# Patient Record
Sex: Female | Born: 1955 | Race: Black or African American | Hispanic: No | Smoking: Current every day smoker
Health system: Southern US, Community
[De-identification: ages and names within clinical notes are randomized; demographics above are authoritative.]

## PROBLEM LIST (undated history)

## (undated) DIAGNOSIS — I10 Essential (primary) hypertension: Secondary | ICD-10-CM

---

## 2001-03-15 ENCOUNTER — Other Ambulatory Visit: Admission: RE | Admit: 2001-03-15 | Discharge: 2001-03-15 | Payer: Self-pay | Admitting: Gynecology

## 2001-03-21 ENCOUNTER — Encounter: Payer: Self-pay | Admitting: Obstetrics and Gynecology

## 2001-03-21 ENCOUNTER — Ambulatory Visit (HOSPITAL_COMMUNITY): Admission: RE | Admit: 2001-03-21 | Discharge: 2001-03-21 | Payer: Self-pay | Admitting: Obstetrics and Gynecology

## 2001-07-07 ENCOUNTER — Emergency Department (HOSPITAL_COMMUNITY): Admission: EM | Admit: 2001-07-07 | Discharge: 2001-07-07 | Payer: Self-pay | Admitting: Emergency Medicine

## 2001-07-08 ENCOUNTER — Emergency Department (HOSPITAL_COMMUNITY): Admission: EM | Admit: 2001-07-08 | Discharge: 2001-07-08 | Payer: Self-pay | Admitting: Internal Medicine

## 2001-12-08 ENCOUNTER — Emergency Department (HOSPITAL_COMMUNITY): Admission: EM | Admit: 2001-12-08 | Discharge: 2001-12-08 | Payer: Self-pay | Admitting: Emergency Medicine

## 2001-12-09 ENCOUNTER — Encounter: Admission: RE | Admit: 2001-12-09 | Discharge: 2001-12-09 | Payer: Self-pay | Admitting: Internal Medicine

## 2001-12-16 ENCOUNTER — Encounter: Admission: RE | Admit: 2001-12-16 | Discharge: 2001-12-16 | Payer: Self-pay | Admitting: Internal Medicine

## 2006-02-25 ENCOUNTER — Emergency Department (HOSPITAL_COMMUNITY): Admission: EM | Admit: 2006-02-25 | Discharge: 2006-02-25 | Payer: Self-pay | Admitting: Emergency Medicine

## 2006-03-31 ENCOUNTER — Emergency Department (HOSPITAL_COMMUNITY): Admission: EM | Admit: 2006-03-31 | Discharge: 2006-03-31 | Payer: Self-pay | Admitting: Family Medicine

## 2010-05-21 ENCOUNTER — Emergency Department (HOSPITAL_COMMUNITY): Admission: EM | Admit: 2010-05-21 | Discharge: 2010-05-21 | Payer: Self-pay | Admitting: Emergency Medicine

## 2013-12-16 ENCOUNTER — Emergency Department (HOSPITAL_COMMUNITY)
Admission: EM | Admit: 2013-12-16 | Discharge: 2013-12-16 | Disposition: A | Discharge status: Home / Self Care | Payer: Self-pay | Attending: Emergency Medicine | Admitting: Emergency Medicine

## 2013-12-16 ENCOUNTER — Encounter (HOSPITAL_COMMUNITY): Payer: Self-pay | Admitting: Emergency Medicine

## 2013-12-16 DIAGNOSIS — H5789 Other specified disorders of eye and adnexa: Secondary | ICD-10-CM | POA: Insufficient documentation

## 2013-12-16 DIAGNOSIS — L235 Allergic contact dermatitis due to other chemical products: Secondary | ICD-10-CM

## 2013-12-16 DIAGNOSIS — Z79899 Other long term (current) drug therapy: Secondary | ICD-10-CM | POA: Insufficient documentation

## 2013-12-16 DIAGNOSIS — L258 Unspecified contact dermatitis due to other agents: Secondary | ICD-10-CM | POA: Insufficient documentation

## 2013-12-16 DIAGNOSIS — IMO0002 Reserved for concepts with insufficient information to code with codable children: Secondary | ICD-10-CM | POA: Insufficient documentation

## 2013-12-16 MED ORDER — FAMOTIDINE 20 MG PO TABS
20.0000 mg | ORAL_TABLET | Freq: Once | ORAL | Status: AC
  Administered 2013-12-16: 20 mg via ORAL
  Filled 2013-12-16: qty 1

## 2013-12-16 MED ORDER — PREDNISONE 20 MG PO TABS
60.0000 mg | ORAL_TABLET | Freq: Once | ORAL | Status: AC
  Administered 2013-12-16: 60 mg via ORAL
  Filled 2013-12-16: qty 3

## 2013-12-16 MED ORDER — DIPHENHYDRAMINE HCL 25 MG PO TABS: 25.0000 mg | ORAL_TABLET | Freq: Four times a day (QID) | ORAL | Status: DC

## 2013-12-16 MED ORDER — FAMOTIDINE 20 MG PO TABS: 20.0000 mg | ORAL_TABLET | Freq: Two times a day (BID) | ORAL | Status: DC

## 2013-12-16 MED ORDER — PREDNISONE 20 MG PO TABS: 60.0000 mg | ORAL_TABLET | Freq: Every day | ORAL | Status: DC

## 2013-12-16 NOTE — ED Notes (Signed)
Pt brought back to room; pt getting undressed and into a gown at this time; Crecencio Mc, RN present in room

## 2013-12-16 NOTE — ED Notes (Addendum)
Pt states that she had a reaction to her hair dye. Pt used a different brand than usual. Pt states that she began itching in her scalp and face on Friday. Pt states that it is itching and swelling in her eyes as well. Pt states that her vision is normal when swelling decreases.  Pt has drainage noted from scalp as well. Pt states that she took allergy medications. Pt speaking in full sentences. NAD noted.

## 2013-12-16 NOTE — Discharge Instructions (Signed)

## 2013-12-19 NOTE — ED Provider Notes (Signed)
Medical screening examination/treatment/procedure(s) were performed by non-physician practitioner and as supervising physician I was immediately available for consultation/collaboration.     Geoffery Lyonsouglas Kyvon Hu, MD 12/19/13 [id number]

## 2013-12-19 NOTE — ED Provider Notes (Signed)
CSN: [id number]     Arrival date & time 12/16/13  0810 History   First MD Initiated Contact with Patient 12/16/13 0813     Chief Complaint  Patient presents with  . Allergic Reaction     (Consider location/radiation/quality/duration/timing/severity/associated sxs/prior Treatment) Patient is a 58 y.o. female presenting with allergic reaction. The history is provided by the patient. No language interpreter was used.  Allergic Reaction Presenting symptoms: itching, rash and swelling   Presenting symptoms: no difficulty swallowing   Severity:  Severe Context comment:  Rash and facial swelling after using a hair color treatment 2 days ago. She reports similar symptoms in the past. No SOB, or difficulty swallowing.   History reviewed. No pertinent past medical history. History reviewed. No pertinent past surgical history. History reviewed. No pertinent family history. History  Substance Use Topics  . Smoking status: Never Smoker   . Smokeless tobacco: Not on file  . Alcohol Use: Not on file   OB History   Grav Para Term Preterm Abortions TAB SAB Ect Mult Living                 Review of Systems  Constitutional: Negative for fever.  HENT: Positive for facial swelling. Negative for trouble swallowing.   Eyes:       Periorbital swelling.  Respiratory: Negative for shortness of breath.   Cardiovascular: Negative for chest pain.  Gastrointestinal: Negative for nausea.  Skin: Positive for itching and rash.  Neurological: Negative for headaches.      Allergies  Codeine  Home Medications   Prior to Admission medications   Medication Sig Start Date End Date Taking? Authorizing Provider  diphenhydrAMINE (SOMINEX) 25 MG tablet Take 25 mg by mouth 2 (two) times daily as needed for itching or allergies.   Yes Historical Provider, MD  diphenhydrAMINE (BENADRYL) 25 MG tablet Take 1 tablet (25 mg total) by mouth every 6 (six) hours. 12/16/13   Albert Hersch A Ziair Penson, PA-C  famotidine (PEPCID)  20 MG tablet Take 1 tablet (20 mg total) by mouth 2 (two) times daily. 12/16/13   Brace Welte A Galaxy Borden, PA-C  predniSONE (DELTASONE) 20 MG tablet Take 3 tablets (60 mg total) by mouth daily. 12/16/13   Melane Windholz A Tatiana Courter, PA-C   BP 142/80  Pulse 106  Temp(Src) 98.4 F (36.9 C) (Oral)  Resp 20  SpO2 96% Physical Exam  Constitutional: She is oriented to person, place, and time. She appears well-developed and well-nourished.  HENT:  Head: Normocephalic.  Eyes:  Significant bilateral periorbital swelling. Clear eye drainage and red conjunctiva without chemosis.   Neck: Normal range of motion. Neck supple.  Cardiovascular: Normal rate and regular rhythm.   Pulmonary/Chest: Effort normal and breath sounds normal.  Abdominal: Soft. Bowel sounds are normal. There is no tenderness. There is no rebound and no guarding.  Musculoskeletal: Normal range of motion.  Neurological: She is alert and oriented to person, place, and time.  Skin: Skin is warm and dry. No rash noted.  Scaling rash to generalized scalp including bilateral ear pinna.   Psychiatric: She has a normal mood and affect.    ED Course  Procedures (including critical care time) Labs Review Labs Reviewed - No data to display  Imaging Review No results found.   EKG Interpretation None      MDM   Final diagnoses:  Allergic contact dermatitis due to chemical    Uncomplicated significant contact dermatitis. Oral steroids given, benadryl, pepcid. Return precautions given.  Arnoldo HookerShari A Osei Anger, PA-C 12/19/13 1222

## 2013-12-24 ENCOUNTER — Encounter (HOSPITAL_COMMUNITY): Payer: Self-pay | Admitting: Emergency Medicine

## 2013-12-24 ENCOUNTER — Emergency Department (HOSPITAL_COMMUNITY)
Admission: EM | Admit: 2013-12-24 | Discharge: 2013-12-24 | Disposition: A | Discharge status: Home / Self Care | Payer: Self-pay | Attending: Emergency Medicine | Admitting: Emergency Medicine

## 2013-12-24 DIAGNOSIS — L235 Allergic contact dermatitis due to other chemical products: Secondary | ICD-10-CM

## 2013-12-24 DIAGNOSIS — L258 Unspecified contact dermatitis due to other agents: Secondary | ICD-10-CM | POA: Insufficient documentation

## 2013-12-24 DIAGNOSIS — IMO0002 Reserved for concepts with insufficient information to code with codable children: Secondary | ICD-10-CM | POA: Insufficient documentation

## 2013-12-24 DIAGNOSIS — Z79899 Other long term (current) drug therapy: Secondary | ICD-10-CM | POA: Insufficient documentation

## 2013-12-24 DIAGNOSIS — F17200 Nicotine dependence, unspecified, uncomplicated: Secondary | ICD-10-CM | POA: Insufficient documentation

## 2013-12-24 MED ORDER — PREDNISONE 20 MG PO TABS: ORAL_TABLET | ORAL | Status: DC

## 2013-12-24 MED ORDER — PREDNISONE 20 MG PO TABS
60.0000 mg | ORAL_TABLET | Freq: Once | ORAL | Status: AC
  Administered 2013-12-24: 60 mg via ORAL
  Filled 2013-12-24: qty 3

## 2013-12-24 NOTE — ED Notes (Signed)
Pt placed in gown and on monitor upon arrival to room. Pt monitored by 5 lead, pulse ox, and blood pressure.

## 2013-12-24 NOTE — Discharge Instructions (Signed)
Please read and follow all provided instructions.  Your diagnoses today include:  1. Chemical induced allergic contact dermatitis     Tests performed today include:  Vital signs. See below for your results today.   Medications prescribed:   Prednisone - steroid medicine   It is best to take this medication in the morning to prevent sleeping problems. If you are diabetic, monitor your blood sugar closely and stop taking Prednisone if blood sugar is over 300. Take with food to prevent stomach upset.   Take any prescribed medications only as directed.  Home care instructions:   Follow any educational materials contained in this packet  Follow-up instructions: Please follow-up with your primary care provider in the next 3 days for further evaluation of your symptoms. If you do not have a primary care doctor -- see below for referral information.   Return instructions:   Please return to the Emergency Department if you experience worsening symptoms.   Call 9-1-1 immediately if you have an allergic reaction that involves your lips, mouth, throat or if you have any difficulty breathing. This is a life-threatening emergency.   Please return if you have any other emergent concerns.  Additional Information:  Your vital signs today were: BP 131/93   Pulse 91   Temp(Src) 98.1 F (36.7 C) (Oral)   Resp 15   SpO2 99% If your blood pressure (BP) was elevated above 135/85 this visit, please have this repeated by your doctor within one month. --------------

## 2013-12-24 NOTE — ED Provider Notes (Signed)
CSN: [id number]     Arrival date & time 12/24/13  1051 History   First MD Initiated Contact with Patient 12/24/13 1235     Chief Complaint  Patient presents with  . Allergic Reaction     (Consider location/radiation/quality/duration/timing/severity/associated sxs/prior Treatment) HPI Comments: Patient presents with complaint of allergic reaction to scalp, ears, and face -- started several days ago after using new hair dye. Patient was seen in emergency department and prescribed prednisone which helped improve her symptoms and itching. Patient had 4 total days of steroids. Upon discontinuation she had symptoms return with much more redness and itchiness. No fevers, trouble breathing, trouble swallowing. No mouth swelling. Patient reports no eye burning or visual disturbance. The onset of this condition was acute. The course is constant. Aggravating factors: none. Alleviating factors: none.    Patient is a 58 y.o. female presenting with allergic reaction. The history is provided by the patient and medical records.  Allergic Reaction Presenting symptoms: rash   Presenting symptoms: no difficulty swallowing and no wheezing     No past medical history on file. No past surgical history on file. No family history on file. History  Substance Use Topics  . Smoking status: Current Every Day Smoker -- 1.00 packs/day    Types: Cigarettes  . Smokeless tobacco: Not on file  . Alcohol Use: No   OB History   Grav Para Term Preterm Abortions TAB SAB Ect Mult Living                 Review of Systems  Constitutional: Negative for fever.  HENT: Negative for facial swelling and trouble swallowing.   Eyes: Negative for redness.  Respiratory: Negative for shortness of breath, wheezing and stridor.   Cardiovascular: Negative for chest pain.  Gastrointestinal: Negative for nausea and vomiting.  Musculoskeletal: Negative for myalgias.  Skin: Positive for rash.  Neurological: Negative for  light-headedness.  Psychiatric/Behavioral: Negative for confusion.      Allergies  Codeine  Home Medications   Prior to Admission medications   Medication Sig Start Date End Date Taking? Authorizing Ephriam Turman  diphenhydrAMINE (BENADRYL) 25 MG tablet Take 1 tablet (25 mg total) by mouth every 6 (six) hours. 12/16/13   Shari A Upstill, PA-C  diphenhydrAMINE (SOMINEX) 25 MG tablet Take 25 mg by mouth 2 (two) times daily as needed for itching or allergies.    Historical Shonita Rinck, MD  famotidine (PEPCID) 20 MG tablet Take 1 tablet (20 mg total) by mouth 2 (two) times daily. 12/16/13   Shari A Upstill, PA-C  predniSONE (DELTASONE) 20 MG tablet Take 3 tablets (60 mg total) by mouth daily. 12/16/13   Shari A Upstill, PA-C   BP 131/93  Pulse 91  Temp(Src) 98.1 F (36.7 C) (Oral)  Resp 15  SpO2 99% Physical Exam  Nursing note and vitals reviewed. Constitutional: She appears well-developed and well-nourished.  HENT:  Head: Normocephalic and atraumatic.  No angioedema  Eyes: Conjunctivae are normal.  Neck: Normal range of motion. Neck supple.  Pulmonary/Chest: No respiratory distress.  Neurological: She is alert.  Skin: Skin is warm and dry.  Patient with erythema of the cheeks and lateral face. Patient with vesicular rash noted on forehead, ears, back of neck, and scalp consistent with contact dermatitis.  Psychiatric: She has a normal mood and affect.    ED Course  Procedures (including critical care time) Labs Review Labs Reviewed - No data to display  Imaging Review No results found.   EKG Interpretation None  1:16 PM Patient seen and examined. Medications ordered.   Vital signs reviewed and are as follows: Filed Vitals:   12/24/13 1245  BP: 131/93  Pulse: 91  Temp:   Resp: 15   Will prescribe 12 day taper course of prednisone. Patient to continue antihistamines for itching. She will avoid this hair product in the future.   MDM   Final diagnoses:  Chemical  induced allergic contact dermatitis   Patient with contact dermatitis. No signs of anaphylaxis. No signs of secondary infection. No contraindications to prednisone noted. Will start back on prednisone and give prolonged taper to prevent rebound.    Renne CriglerJoshua Geiple, PA-C 12/24/13 1326

## 2013-12-24 NOTE — ED Notes (Signed)
Pt reports recent allergic reaction to hair color. States when she finished prescription for prednisone, itching, reddness, swelling around face returned. Pt currently awake, alert, oriented x4, NAD at present.

## 2013-12-25 NOTE — ED Provider Notes (Signed)
Medical screening examination/treatment/procedure(s) were performed by non-physician practitioner and as supervising physician I was immediately available for consultation/collaboration.   EKG Interpretation None       Raeford RazorStephen Kohut, MD 12/25/13 [phone]

## 2015-11-15 ENCOUNTER — Emergency Department (HOSPITAL_COMMUNITY)
Admission: EM | Admit: 2015-11-15 | Discharge: 2015-11-15 | Disposition: A | Discharge status: Home / Self Care | Payer: No Typology Code available for payment source | Attending: Emergency Medicine | Admitting: Emergency Medicine

## 2015-11-15 ENCOUNTER — Encounter (HOSPITAL_COMMUNITY): Payer: Self-pay | Admitting: Emergency Medicine

## 2015-11-15 ENCOUNTER — Emergency Department (HOSPITAL_COMMUNITY): Payer: No Typology Code available for payment source

## 2015-11-15 DIAGNOSIS — S161XXA Strain of muscle, fascia and tendon at neck level, initial encounter: Secondary | ICD-10-CM | POA: Diagnosis not present

## 2015-11-15 DIAGNOSIS — S199XXA Unspecified injury of neck, initial encounter: Secondary | ICD-10-CM | POA: Diagnosis present

## 2015-11-15 DIAGNOSIS — Z79899 Other long term (current) drug therapy: Secondary | ICD-10-CM | POA: Diagnosis not present

## 2015-11-15 DIAGNOSIS — Y998 Other external cause status: Secondary | ICD-10-CM | POA: Insufficient documentation

## 2015-11-15 DIAGNOSIS — Y92410 Unspecified street and highway as the place of occurrence of the external cause: Secondary | ICD-10-CM | POA: Insufficient documentation

## 2015-11-15 DIAGNOSIS — F17210 Nicotine dependence, cigarettes, uncomplicated: Secondary | ICD-10-CM | POA: Insufficient documentation

## 2015-11-15 DIAGNOSIS — Y9389 Activity, other specified: Secondary | ICD-10-CM | POA: Insufficient documentation

## 2015-11-15 DIAGNOSIS — I10 Essential (primary) hypertension: Secondary | ICD-10-CM | POA: Insufficient documentation

## 2015-11-15 DIAGNOSIS — S4992XA Unspecified injury of left shoulder and upper arm, initial encounter: Secondary | ICD-10-CM | POA: Diagnosis not present

## 2015-11-15 HISTORY — DX: Essential (primary) hypertension: I10

## 2015-11-15 MED ORDER — CYCLOBENZAPRINE HCL 10 MG PO TABS
5.0000 mg | ORAL_TABLET | Freq: Once | ORAL | Status: AC
  Administered 2015-11-15: 5 mg via ORAL
  Filled 2015-11-15: qty 1

## 2015-11-15 MED ORDER — CYCLOBENZAPRINE HCL 10 MG PO TABS: 10.0000 mg | ORAL_TABLET | Freq: Two times a day (BID) | ORAL | Status: AC | PRN

## 2015-11-15 MED ORDER — ACETAMINOPHEN 325 MG PO TABS
650.0000 mg | ORAL_TABLET | Freq: Once | ORAL | Status: AC
  Administered 2015-11-15: 650 mg via ORAL
  Filled 2015-11-15: qty 2

## 2015-11-15 MED ORDER — ACETAMINOPHEN 500 MG PO TABS: 500.0000 mg | ORAL_TABLET | Freq: Four times a day (QID) | ORAL | Status: DC | PRN
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
## 2021-06-27 ENCOUNTER — Other Ambulatory Visit: Payer: Self-pay

## 2021-06-27 ENCOUNTER — Encounter (HOSPITAL_COMMUNITY): Payer: Self-pay

## 2021-06-27 ENCOUNTER — Emergency Department (HOSPITAL_COMMUNITY)
Admission: EM | Admit: 2021-06-27 | Discharge: 2021-06-27 | Disposition: A | Discharge status: Home / Self Care | Payer: Self-pay | Attending: Emergency Medicine | Admitting: Emergency Medicine

## 2021-06-27 DIAGNOSIS — T7840XA Allergy, unspecified, initial encounter: Secondary | ICD-10-CM | POA: Insufficient documentation

## 2021-06-27 DIAGNOSIS — F17210 Nicotine dependence, cigarettes, uncomplicated: Secondary | ICD-10-CM | POA: Insufficient documentation

## 2021-06-27 DIAGNOSIS — I10 Essential (primary) hypertension: Secondary | ICD-10-CM | POA: Insufficient documentation

## 2021-06-27 MED ORDER — DIPHENHYDRAMINE HCL 25 MG PO CAPS
50.0000 mg | ORAL_CAPSULE | Freq: Once | ORAL | Status: AC
  Administered 2021-06-27: 03:00:00 50 mg via ORAL
  Filled 2021-06-27: qty 2

## 2021-06-27 MED ORDER — FAMOTIDINE 20 MG PO TABS
20.0000 mg | ORAL_TABLET | Freq: Once | ORAL | Status: AC
  Administered 2021-06-27: 03:00:00 20 mg via ORAL
  Filled 2021-06-27: qty 1

## 2021-06-27 MED ORDER — PREDNISONE 20 MG PO TABS
60.0000 mg | ORAL_TABLET | Freq: Once | ORAL | Status: AC
  Administered 2021-06-27: 03:00:00 60 mg via ORAL
  Filled 2021-06-27: qty 3

## 2021-06-27 MED ORDER — FAMOTIDINE 20 MG PO TABS: 20.0000 mg | ORAL_TABLET | Freq: Two times a day (BID) | ORAL | 0 refills | Status: AC

## 2021-06-27 MED ORDER — DIPHENHYDRAMINE HCL 25 MG PO TABS: 25.0000 mg | ORAL_TABLET | Freq: Four times a day (QID) | ORAL | 0 refills | Status: AC

## 2021-06-27 MED ORDER — PREDNISONE 20 MG PO TABS: 40.0000 mg | ORAL_TABLET | Freq: Every day | ORAL | 0 refills | Status: AC

## 2021-06-27 NOTE — ED Provider Notes (Signed)
[facility] Emergency Department Provider Note MRN:  [id number]  Arrival date & time: 06/27/21     Chief Complaint   Allergic Reaction   History of Present Illness   Kayla Blackburn is a 65 y.o. year-old female with no pertinent past medical history presenting to the ED with chief complaint of allergic reaction.  Patient used a hair dye 2 days ago and since then has been experiencing swelling to the periorbital region and scattered rash to the scalp and back of the neck.  Has had a similar allergic reaction to a similar product several years ago.  Swelling is starting to make it difficult to open her eyes.  No swelling to the lips or tongue, no sensation of throat closure, no shortness of breath, no vomiting or diarrhea.  Symptoms mild to moderate, constant, no exacerbating or alleviating factors.  Review of Systems  A complete 10 system review of systems was obtained and all systems are negative except as noted in the HPI and PMH.   Patient's Health History    Past Medical History:  Diagnosis Date   Hypertension     History reviewed. No pertinent surgical history.  History reviewed. No pertinent family history.  Social History   Socioeconomic History   Marital status: Divorced    Spouse name: Not on file   Number of children: Not on file   Years of education: Not on file   Highest education level: Not on file  Occupational History   Not on file  Tobacco Use   Smoking status: Every Day    Packs/day: 1.00    Types: Cigarettes   Smokeless tobacco: Not on file  Substance and Sexual Activity   Alcohol use: No   Drug use: No   Sexual activity: Never    Birth control/protection: None  Other Topics Concern   Not on file  Social History Narrative   Not on file   Social Determinants of Health   Financial Resource Strain: Not on file  Food Insecurity: Not on file  Transportation Needs: Not on file  Physical Activity: Not on file  Stress: Not  on file  Social Connections: Not on file  Intimate Partner Violence: Not on file     Physical Exam   Vitals:   06/27/21 0159  BP: (!) 164/85  Pulse: 96  Resp: 20  Temp: 98 F (36.7 C)  SpO2: 98%    CONSTITUTIONAL: Well-appearing, NAD NEURO:  Alert and oriented x 3, no focal deficits EYES:  eyes equal and reactive ENT/NECK:  no LAD, no JVD CARDIO: Regular rate, well-perfused, normal S1 and S2 PULM:  CTAB no wheezing or rhonchi GI/GU:  normal bowel sounds, non-distended, non-tender MSK/SPINE:  No gross deformities, no edema SKIN: Prominent periorbital edema bilaterally, right greater than left, scattered maculopapular rash to the neck posteriorly PSYCH:  Appropriate speech and behavior  *Additional and/or pertinent findings included in MDM below  Diagnostic and Interventional Summary    EKG Interpretation  Date/Time:    Ventricular Rate:    PR Interval:    QRS Duration:   QT Interval:    QTC Calculation:   R Axis:     Text Interpretation:         Labs Reviewed - No data to display  No orders to display    Medications  predniSONE (DELTASONE) tablet 60 mg (has no administration in time range)  diphenhydrAMINE (BENADRYL) capsule 50 mg (has no administration in time range)  famotidine (PEPCID)  tablet 20 mg (has no administration in time range)     Procedures  /  Critical Care Procedures  ED Course and Medical Decision Making  I have reviewed the triage vital signs, the nursing notes, and pertinent available records from the EMR.  Listed above are laboratory and imaging tests that I personally ordered, reviewed, and interpreted and then considered in my medical decision making (see below for details).  Consistent with allergic reaction, given the facial involvement will provide a burst of steroids, antihistamines.  No airway concerns, nothing to suggest anaphylaxis, appropriate for discharge.       Elmer Sow. Pilar Plate, MD [facility] Emergency Medicine [facility] [email or web address]  Final Clinical Impressions(s) / ED Diagnoses     ICD-10-CM   1. Allergic reaction, initial encounter  T78.40XA       ED Discharge Orders          Ordered    predniSONE (DELTASONE) 20 MG tablet  Daily        06/27/21 0255    diphenhydrAMINE (BENADRYL) 25 MG tablet  Every 6 hours        06/27/21 0255    famotidine (PEPCID) 20 MG tablet  2 times daily        06/27/21 0255             Discharge Instructions Discussed with and Provided to Patient:    Discharge Instructions      You were evaluated in the Emergency Department and after careful evaluation, we did not find any emergent condition requiring admission or further testing in the hospital.  Your exam/testing today is overall reassuring.  Symptoms seem to be due to an allergic reaction.  Please take the prednisone medication as directed.  Use the Benadryl and Pepcid as needed for swelling or itching.  Please return to the Emergency Department if you experience any worsening of your condition.   Thank you for allowing [place] to be a part of your care.       Sabas Sous, MD 06/27/21 [phone]

## 2021-06-27 NOTE — ED Triage Notes (Signed)
Pt reports with bilateral eye swelling after dying her hair on Thursday.Pt states that the swelling started 2 days ago. Pt has a rash to the back of her neck and scabs on her scalp.

## 2021-06-27 NOTE — Discharge Instructions (Signed)
You were evaluated in the Emergency Department and after careful evaluation, we did not find any emergent condition requiring admission or further testing in the hospital.  Your exam/testing today is overall reassuring.  Symptoms seem to be due to an allergic reaction.  Please take the prednisone medication as directed.  Use the Benadryl and Pepcid as needed for swelling or itching.  Please return to the Emergency Department if you experience any worsening of your condition.   Thank you for allowing [place] to be a part of your care.

## 2021-07-06 ENCOUNTER — Encounter (HOSPITAL_COMMUNITY): Payer: Self-pay

## 2021-07-06 ENCOUNTER — Other Ambulatory Visit: Payer: Self-pay

## 2021-07-06 ENCOUNTER — Ambulatory Visit (HOSPITAL_COMMUNITY)
Admission: EM | Admit: 2021-07-06 | Discharge: 2021-07-06 | Disposition: A | Discharge status: Home / Self Care | Payer: Self-pay | Attending: Internal Medicine | Admitting: Internal Medicine

## 2021-07-06 DIAGNOSIS — T7849XA Other allergy, initial encounter: Secondary | ICD-10-CM

## 2021-07-06 MED ORDER — PREDNISONE 10 MG (21) PO TBPK: ORAL_TABLET | Freq: Every day | ORAL | 0 refills | Status: DC

## 2021-07-06 MED ORDER — CEPHALEXIN 500 MG PO CAPS: 500.0000 mg | ORAL_CAPSULE | Freq: Three times a day (TID) | ORAL | 0 refills | Status: AC

## 2021-07-06 MED ORDER — TERBINAFINE HCL 250 MG PO TABS: 250.0000 mg | ORAL_TABLET | Freq: Every day | ORAL | 0 refills | Status: AC

## 2021-07-06 MED ORDER — HYDROXYZINE PAMOATE 50 MG PO CAPS: 50.0000 mg | ORAL_CAPSULE | Freq: Three times a day (TID) | ORAL | 0 refills | Status: DC | PRN

## 2021-07-06 NOTE — ED Triage Notes (Signed)
Pt reports bilateral face swelling after dying her on 06/23/2021. She reports being allergic to the hair dye. Pt reports ears are bleeding and she has scabs that have develop.

## 2021-07-06 NOTE — ED Provider Notes (Signed)
[facility]    CSN: [id number] Arrival date & time: 07/06/21  1014      History   Chief Complaint Chief Complaint  Patient presents with   Rash    Face swelling and scabs on both ears after using hair dye on 06/23/2021.    HPI Kayla Blackburn is a 65 y.o. female comes to the urgent care with  itchy weepy scalp which started about 2 weeks ago after she used dyed her hair.  Patient was seen in the emergency department and started on steroids, Benadryl and famotidine.  Patient's symptoms have not improved significantly over the past couple of weeks.She complains of continuous itching and weeping of serous fluid from the scalp. No fever or chills. No nausea or vomiting.   HPI  Past Medical History:  Diagnosis Date   Hypertension     There are no problems to display for this patient.   No past surgical history on file.  OB History   No obstetric history on file.      Home Medications    Prior to Admission medications   Medication Sig Start Date End Date Taking? Authorizing Provider  cephALEXin (KEFLEX) 500 MG capsule Take 1 capsule (500 mg total) by mouth 3 (three) times daily for 7 days. 07/06/21 07/13/21 Yes Khamia Stambaugh, Britta Mccreedy, MD  hydrOXYzine (VISTARIL) 50 MG capsule Take 1 capsule (50 mg total) by mouth 3 (three) times daily as needed. 07/06/21  Yes Micco Bourbeau, Britta Mccreedy, MD  predniSONE (STERAPRED UNI-PAK 21 TAB) 10 MG (21) TBPK tablet Take by mouth daily. Take 6 tabs by mouth daily  for 2 days, then 5 tabs for 2 days, then 4 tabs for 2 days, then 3 tabs for 2 days, 2 tabs for 2 days, then 1 tab by mouth daily for 2 days 07/06/21  Yes Mariangela Heldt, Britta Mccreedy, MD  terbinafine (LAMISIL) 250 MG tablet Take 1 tablet (250 mg total) by mouth daily for 14 days. 07/06/21 07/20/21 Yes Pricella Gaugh, Britta Mccreedy, MD  acetaminophen (TYLENOL) 500 MG tablet Take 1 tablet (500 mg total) by mouth every 6 (six) hours as needed. 11/15/15   Cheri Fowler, PA-C  cyclobenzaprine (FLEXERIL) 10 MG tablet  Take 1 tablet (10 mg total) by mouth 2 (two) times daily as needed for muscle spasms. 11/15/15   Cheri Fowler, PA-C  diphenhydrAMINE (BENADRYL) 25 MG tablet Take 1 tablet (25 mg total) by mouth every 6 (six) hours for 4 days. 06/27/21 07/01/21  Sabas Sous, MD  famotidine (PEPCID) 20 MG tablet Take 1 tablet (20 mg total) by mouth 2 (two) times daily for 4 days. 06/27/21 07/01/21  Sabas Sous, MD    Family History No family history on file.  Social History Social History   Tobacco Use   Smoking status: Every Day    Packs/day: 1.00    Types: Cigarettes  Substance Use Topics   Alcohol use: No   Drug use: No     Allergies   Codeine   Review of Systems Review of Systems  Musculoskeletal: Negative.   Skin:  Positive for color change, rash and wound.  Neurological: Negative.     Physical Exam Triage Vital Signs ED Triage Vitals [07/06/21 1201]  Enc Vitals Group     BP 135/83     Pulse Rate 94     Resp 16     Temp 98.7 F (37.1 C)     Temp Source Oral     SpO2 100 %  Weight      Height      Head Circumference      Peak Flow      Pain Score 0     Pain Loc      Pain Edu?      Excl. in GC?    No data found.  Updated Vital Signs BP 135/83 (BP Location: Left Arm)    Pulse 94    Temp 98.7 F (37.1 C) (Oral)    Resp 16    SpO2 100%   Visual Acuity Right Eye Distance:   Left Eye Distance:   Bilateral Distance:    Right Eye Near:   Left Eye Near:    Bilateral Near:     Physical Exam Vitals and nursing note reviewed.  Constitutional:      General: She is not in acute distress.    Appearance: She is not ill-appearing.  Cardiovascular:     Rate and Rhythm: Normal rate and regular rhythm.  Musculoskeletal:        General: Normal range of motion.  Skin:    Comments: Hypopigmented rash over the scalp.  Patient's forehead and the neck has a rash which is mildly erythematous.  Excoriation of the skin noted.  Similar lesions over the ears as well.   Neurological:     Mental Status: She is alert.     UC Treatments / Results  Labs (all labs ordered are listed, but only abnormal results are displayed) Labs Reviewed - No data to display  EKG   Radiology No results found.  Procedures Procedures (including critical care time)  Medications Ordered in UC Medications - No data to display  Initial Impression / Assessment and Plan / UC Course  I have reviewed the triage vital signs and the nursing notes.  Pertinent labs & imaging results that were available during my care of the patient were reviewed by me and considered in my medical decision making (see chart for details).     1.  Contact dermatitis secondary to cosmetics: Tapering dose of steroids Lamisil Keflex Vistaril as needed for itching Patient is advised to wash her hair with Selsun Blue to help with excoriation and scaling of the scalp Return to urgent care if symptoms worsen. Final Clinical Impressions(s) / UC Diagnoses   Final diagnoses:  Allergic reaction to cosmetics     Discharge Instructions      Please use Selsun Blue shampoo to wash your hair Keep your scalp dry and avoid wearing head scarf Take medications as prescribed If symptoms worsen please return to urgent care to be reevaluated   ED Prescriptions     Medication Sig Dispense Auth. Provider   hydrOXYzine (VISTARIL) 50 MG capsule Take 1 capsule (50 mg total) by mouth 3 (three) times daily as needed. 30 capsule Lyam Provencio, Britta Mccreedy, MD   terbinafine (LAMISIL) 250 MG tablet Take 1 tablet (250 mg total) by mouth daily for 14 days. 14 tablet Karrington Mccravy, Britta Mccreedy, MD   cephALEXin (KEFLEX) 500 MG capsule Take 1 capsule (500 mg total) by mouth 3 (three) times daily for 7 days. 20 capsule Isaac Lacson, Britta Mccreedy, MD   predniSONE (STERAPRED UNI-PAK 21 TAB) 10 MG (21) TBPK tablet Take by mouth daily. Take 6 tabs by mouth daily  for 2 days, then 5 tabs for 2 days, then 4 tabs for 2 days, then 3 tabs for 2 days,  2 tabs for 2 days, then 1 tab by mouth daily for 2 days 42 tablet  Viraj Liby, Britta Mccreedy, MD      PDMP not reviewed this encounter.   Merrilee Jansky, MD 07/06/21 1336

## 2021-07-06 NOTE — Discharge Instructions (Addendum)
Please use Selsun Blue shampoo to wash your hair Keep your scalp dry and avoid wearing head scarf Take medications as prescribed If symptoms worsen please return to urgent care to be reevaluated

## 2022-07-10 ENCOUNTER — Telehealth: Payer: Self-pay | Admitting: Family

## 2022-07-10 ENCOUNTER — Other Ambulatory Visit (HOSPITAL_COMMUNITY): Payer: Self-pay

## 2022-07-10 MED ORDER — HYDROXYZINE PAMOATE 50 MG PO CAPS
50.0000 mg | ORAL_CAPSULE | Freq: Three times a day (TID) | ORAL | 0 refills | Status: AC | PRN
  Filled 2022-07-10: qty 30, 10d supply, fill #0

## 2022-07-10 NOTE — Telephone Encounter (Signed)
Allergic reaction to hair dye. Vistaril 50 mg up to 3 times daily, #30 with no RF's.RX for above e-scribed and sent to pharmacy on record  [place] [address] Phone: [phone] Fax: [phone]

## 2022-07-11 ENCOUNTER — Other Ambulatory Visit (HOSPITAL_COMMUNITY): Payer: Self-pay

## 2022-07-25 ENCOUNTER — Other Ambulatory Visit (HOSPITAL_COMMUNITY): Payer: Self-pay

## 2022-08-08 ENCOUNTER — Ambulatory Visit: Payer: Self-pay | Admitting: Family Medicine

## 2022-08-22 ENCOUNTER — Encounter: Payer: Self-pay | Admitting: Family Medicine

## 2022-08-22 ENCOUNTER — Ambulatory Visit (INDEPENDENT_AMBULATORY_CARE_PROVIDER_SITE_OTHER): Payer: Commercial Managed Care - HMO | Admitting: Family Medicine

## 2022-08-22 ENCOUNTER — Other Ambulatory Visit: Payer: Self-pay | Admitting: Family Medicine

## 2022-08-22 VITALS — BP 126/80 | HR 97 | Temp 98.6°F | Ht 65.0 in | Wt 256.8 lb

## 2022-08-22 DIAGNOSIS — Z1231 Encounter for screening mammogram for malignant neoplasm of breast: Secondary | ICD-10-CM

## 2022-08-22 DIAGNOSIS — Z889 Allergy status to unspecified drugs, medicaments and biological substances status: Secondary | ICD-10-CM | POA: Diagnosis not present

## 2022-08-22 DIAGNOSIS — Z1211 Encounter for screening for malignant neoplasm of colon: Secondary | ICD-10-CM | POA: Diagnosis not present

## 2022-08-22 DIAGNOSIS — Z0182 Encounter for allergy testing: Secondary | ICD-10-CM

## 2022-08-22 DIAGNOSIS — Z1159 Encounter for screening for other viral diseases: Secondary | ICD-10-CM | POA: Diagnosis not present

## 2022-08-22 DIAGNOSIS — Z7689 Persons encountering health services in other specified circumstances: Secondary | ICD-10-CM

## 2022-08-22 DIAGNOSIS — Z114 Encounter for screening for human immunodeficiency virus [HIV]: Secondary | ICD-10-CM | POA: Diagnosis not present

## 2022-08-22 DIAGNOSIS — Z8639 Personal history of other endocrine, nutritional and metabolic disease: Secondary | ICD-10-CM

## 2022-08-22 NOTE — Patient Instructions (Addendum)
It was great seeing you today!  You came in to establish care, and we are going to check your blood work  I have placed a referral to an allergist and they will call you to schedule that appointment.   You can get your Mammogram at [facility] center- call to schedule to get that done (sheet provided)   Please check-out at the front desk before leaving the clinic. I'd like to see you back in 2 weeks but if you need to be seen earlier than that for any new issues we're happy to fit you in, just give [place] a call!  Feel free to call with any questions or concerns at any time, at [phone].   Take care,  Dr. Shary Key [facility]

## 2022-08-22 NOTE — Progress Notes (Unsigned)
    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 No LE edema  ASSESSMENT/PLAN:   History of thyroid disease Patient endorses history of thyroid disease. Will check TSH  Encounter to establish care Patient presents to establish care. States it has been years since she was seen by a physician due to insurance. PMH of thyroid disease and allergic reaction. Denies hx of HTN or diabetes, etc. Will get baseline set up labs today. Recommended following up in 2 weeks to discuss results and anything else we did not get to today. - CBC, CMP, A1c   History of allergic reaction Endorses  a past reaction to hair dye products. Denies ever having an anaphylactic reaction. Requests referral to an allergist for allergy testing. - referral to [facility] - mammogram ordered  - Fit testing for CRC screen  - Hep C screen - HIV  For follow up would liek video visit   [place]

## 2022-08-23 LAB — COMPREHENSIVE METABOLIC PANEL
ALT: 20 IU/L (ref 0–32)
AST: 20 IU/L (ref 0–40)
Albumin/Globulin Ratio: 1.2 (ref 1.2–2.2)
Albumin: 4 g/dL (ref 3.9–4.9)
Alkaline Phosphatase: 78 IU/L (ref 44–121)
BUN/Creatinine Ratio: 14 (ref 12–28)
BUN: 14 mg/dL (ref 8–27)
Bilirubin Total: 0.7 mg/dL (ref 0.0–1.2)
CO2: 23 mmol/L (ref 20–29)
Calcium: 9 mg/dL (ref 8.7–10.3)
Chloride: 105 mmol/L (ref 96–106)
Creatinine, Ser: 1.01 mg/dL — ABNORMAL HIGH (ref 0.57–1.00)
Globulin, Total: 3.4 g/dL (ref 1.5–4.5)
Glucose: 91 mg/dL (ref 70–99)
Potassium: 3.4 mmol/L — ABNORMAL LOW (ref 3.5–5.2)
Sodium: 143 mmol/L (ref 134–144)
Total Protein: 7.4 g/dL (ref 6.0–8.5)
eGFR: 62 mL/min/{1.73_m2} (ref >59)

## 2022-08-23 LAB — HEMOGLOBIN A1C
Est. average glucose Bld gHb Est-mCnc: 126 mg/dL
Hgb A1c MFr Bld: 6 % — ABNORMAL HIGH (ref 4.8–5.6)

## 2022-08-23 LAB — CBC
Hematocrit: 39.8 % (ref 34.0–46.6)
Hemoglobin: 13.6 g/dL (ref 11.1–15.9)
MCH: 29.8 pg (ref 26.6–33.0)
MCHC: 34.2 g/dL (ref 31.5–35.7)
MCV: 87 fL (ref 79–97)
Platelets: 177 10*3/uL (ref 150–450)
RBC: 4.56 x10E6/uL (ref 3.77–5.28)
RDW: 13.1 % (ref 11.7–15.4)
WBC: 8 10*3/uL (ref 3.4–10.8)

## 2022-08-23 LAB — HCV AB W REFLEX TO QUANT PCR: HCV Ab: NONREACTIVE

## 2022-08-23 LAB — HIV ANTIBODY (ROUTINE TESTING W REFLEX): HIV Screen 4th Generation wRfx: NONREACTIVE

## 2022-08-23 LAB — TSH: TSH: 1.01 u[IU]/mL (ref 0.450–4.500)

## 2022-08-23 LAB — HCV INTERPRETATION

## 2022-08-24 ENCOUNTER — Telehealth: Payer: Self-pay | Admitting: Family Medicine

## 2022-08-24 DIAGNOSIS — Z8639 Personal history of other endocrine, nutritional and metabolic disease: Secondary | ICD-10-CM | POA: Insufficient documentation

## 2022-08-24 DIAGNOSIS — Z7689 Persons encountering health services in other specified circumstances: Secondary | ICD-10-CM | POA: Insufficient documentation

## 2022-08-24 DIAGNOSIS — Z889 Allergy status to unspecified drugs, medicaments and biological substances status: Secondary | ICD-10-CM | POA: Insufficient documentation

## 2022-08-24 NOTE — Assessment & Plan Note (Signed)
Endorses  a past reaction to hair dye products. Denies ever having an anaphylactic reaction. Requests referral to an allergist for allergy testing. - referral to Allergist

## 2022-08-24 NOTE — Assessment & Plan Note (Signed)
Patient presents to establish care. States it has been years since she was seen by a physician due to insurance. PMH of thyroid disease and allergic reaction. Denies hx of HTN or diabetes, etc. Will get baseline set up labs today. Recommended following up in 2 weeks to discuss results and anything else we did not get to today. - CBC, CMP, A1c

## 2022-08-24 NOTE — Telephone Encounter (Signed)
Patients daughter (D) called stating she is needing to schedule a 2 week follow up with doctor. She stated doctor said it was okay to do video visit (Needing approval) and she stated the doctor was willing to come in and work with their schedule patients daughter stated they are needing Thursday at either 11:30 or 1:30.   Please Advise.   Thanks!

## 2022-08-24 NOTE — Assessment & Plan Note (Signed)
Patient endorses history of thyroid disease. Will check TSH

## 2022-09-18 ENCOUNTER — Encounter: Payer: Self-pay | Admitting: Family Medicine

## 2022-10-09 ENCOUNTER — Ambulatory Visit
Admission: RE | Admit: 2022-10-09 | Discharge: 2022-10-09 | Disposition: A | Discharge status: Home / Self Care | Payer: Commercial Managed Care - HMO | Source: Ambulatory Visit | Attending: Family Medicine | Admitting: Family Medicine

## 2022-10-09 DIAGNOSIS — Z1231 Encounter for screening mammogram for malignant neoplasm of breast: Secondary | ICD-10-CM

## 2022-10-12 ENCOUNTER — Other Ambulatory Visit: Payer: Self-pay | Admitting: Family Medicine

## 2022-10-12 DIAGNOSIS — R928 Other abnormal and inconclusive findings on diagnostic imaging of breast: Secondary | ICD-10-CM

## 2022-12-15 ENCOUNTER — Other Ambulatory Visit: Payer: Self-pay | Admitting: Family Medicine

## 2022-12-15 ENCOUNTER — Ambulatory Visit
Admission: RE | Admit: 2022-12-15 | Discharge: 2022-12-15 | Disposition: A | Discharge status: Home / Self Care | Payer: Commercial Managed Care - HMO | Source: Ambulatory Visit | Attending: Family Medicine | Admitting: Family Medicine

## 2022-12-15 DIAGNOSIS — R928 Other abnormal and inconclusive findings on diagnostic imaging of breast: Secondary | ICD-10-CM

## 2022-12-15 DIAGNOSIS — R599 Enlarged lymph nodes, unspecified: Secondary | ICD-10-CM

## 2022-12-15 DIAGNOSIS — R922 Inconclusive mammogram: Secondary | ICD-10-CM | POA: Diagnosis not present

## 2022-12-15 DIAGNOSIS — R590 Localized enlarged lymph nodes: Secondary | ICD-10-CM | POA: Diagnosis not present

## 2022-12-25 NOTE — Progress Notes (Unsigned)
NEW PATIENT Date of Service/Encounter:  12/27/22 Referring provider: Carney Blackburn, * Primary care provider: Cora Collum, DO  Subjective:  Kayla Blackburn is a 67 y.o. female with a PMHx of disease presenting today for evaluation of "allergies" History obtained from: chart review and patient.  Ibuprofen allergy. Throat swelling, occurred many years ago. She will take tylenol for pain. Aspirin upsets her stomach.  Itchy throat, itchy lips and swelling with citrus fruits and apples. If she eats too much, she will itch everywhere. She has been told in the past that peanut was positive on testing, but she eats peanut butter without symptoms. She can eat baked apples without problems-had apple pie yesterday.  Chronic rhinitis: very mild Symptoms include: sneezing, watery eyes, and itchy eyes  Occurs seasonally-spring, summer Potential triggers: unknown Treatments tried: none Previous allergy testing: yes-years ago, unsure if positive History of reflux/heartburn: yes-will use peppermint or baking soda but very seldom, less than once per week Previous sinus, ear, tonsil, adenoid surgeries: no She is a smoker.  Rash: Occurred around her neck-lasted about one month.  It was little fine bumps that were extremely itchy.  Using hydrocortisone cream and neosporin ointment, but it continued to itch. Now resolved Unclear trigger. Heat made it worse. 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  She did get gel nails for the first time, and now her nails are not growing back and split across. She suspect this rash was related to gel nails. Also now resolved. Hair dye-used permanent color for years, but for the past 5 to 6 years can only use semi-color because 2-3 times using permanent color and her face swells, scalp itches, eyes swell, scalp swells.  PCP notes from referral on 08/22/22-"History of  allergic reaction Endorses  a past reaction to hair dye products. Denies ever having an anaphylactic reaction. Requests referral to an allergist for allergy testing. - referral to Allergist" ED visit 07/06/2021-patient swelling and scabs on both ears after using hair dye on 06/23/2021  Other allergy screening: Asthma: no Hymenoptera allergy: no Urticaria: no Eczema:no History of recurrent infections suggestive of immunodeficency: no Vaccinations are up to date.   Past Medical History: Past Medical History:  Diagnosis Date   Hypertension    Medication List:  Current Outpatient Medications  Medication Sig Dispense Refill   cetirizine (ZYRTEC ALLERGY) 10 MG tablet Take 1 tablet (10 mg total) by mouth daily as needed for allergies. 30 tablet 5   Olopatadine HCl (PATADAY) 0.2 % SOLN Place 1 drop into both eyes daily as needed. 2.5 mL 5   acetaminophen (TYLENOL) 500 MG tablet Take 1 tablet (500 mg total) by mouth every 6 (six) hours as needed. (Patient not taking: Reported on 12/27/2022) 30 tablet 0   cyclobenzaprine (FLEXERIL) 10 MG tablet Take 1 tablet (10 mg total) by mouth 2 (two) times daily as needed for muscle spasms. (Patient not taking: Reported on 12/27/2022) 20 tablet 0   diphenhydrAMINE (BENADRYL) 25 MG tablet Take 1 tablet (25 mg total) by mouth every 6 (six) hours for 4 days. 16 tablet 0   famotidine (PEPCID) 20 MG tablet Take 1 tablet (20 mg total) by mouth 2 (two) times daily for 4 days. 8 tablet 0   hydrOXYzine (VISTARIL) 50 MG capsule Take 1 capsule (50 mg total) by mouth 3 (three) times daily as needed. (Patient not taking: Reported on 12/27/2022) 30 capsule 0  predniSONE (STERAPRED UNI-PAK 21 TAB) 10 MG (21) TBPK tablet Take by mouth daily. Take 6 tabs by mouth daily  for 2 days, then 5 tabs for 2 days, then 4 tabs for 2 days, then 3 tabs for 2 days, 2 tabs for 2 days, then 1 tab by mouth daily for 2 days (Patient not taking: Reported on 12/27/2022) 42 tablet 0   No current  facility-administered medications for this visit.   Known Allergies:  Allergies  Allergen Reactions   Codeine Hives, Itching and Swelling   Ibuprofen Swelling    Tolerates tylenol. Should avoid ibuprofen and naproxen as these are structurally related.    Past Surgical History: History reviewed. No pertinent surgical history. 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 Seh does smoke cigarettes 1 ppd x 30 years..   ROS:  All other systems negative except as noted per HPI.  Objective:  Blood pressure (!) 160/98, pulse 69, temperature 97.7 F (36.5 C), temperature source Temporal, resp. rate 16, height 5\' 6"  (1.676 m), weight 244 lb 1.6 oz (110.7 kg), SpO2 96 %. Body mass index is 39.4 kg/m. Physical Exam:  General Appearance:  Alert, cooperative, no distress, appears stated age  Head:  Normocephalic, without obvious abnormality, atraumatic  Eyes:  Conjunctiva clear, EOM's intact  Nose: Nares normal, hypertrophic turbinates, normal mucosa, and no visible anterior polyps  Throat: Lips, tongue normal; teeth and gums normal, normal posterior oropharynx  Neck: Supple, symmetrical  Lungs:   clear to auscultation bilaterally, Respirations unlabored, no coughing  Heart:  regular rate and rhythm and no murmur, Appears well perfused  Extremities: No edema  Skin: Skin color, texture, turgor normal and no rashes or lesions on visualized portions of skin  Neurologic: No gross deficits     Diagnostics Skin Testing: Environmental allergy panel and select foods. Adequate positive and negative controls. Results discussed with patient/family.  Airborne Adult Perc - 12/27/22 1054      Time Antigen Placed 1035    Allergen Manufacturer Waynette Buttery    Location Back    Number of Test 55    Panel 1 Select    1. Control-Buffer 50% Glycerol Negative    2. Control-Histamine 3+    3. Bahia Negative    4. [place] 3+    5. Johnson 3+    6. Kentucky Blue 2+    7. Meadow Fescue 3+    8. Perennial Rye Negative    9. Timothy 3+    10. Ragweed Mix Negative    11. Cocklebur 3+    12. Plantain,  English 3+    13. Baccharis 3+    14. Dog Fennel Negative    15. Russian Thistle Negative    16. Lamb's Quarters 2+    17. Sheep Sorrell 3+    18. Rough Pigweed Negative    19. Marsh Elder, Rough 3+    20. Mugwort, Common 3+    21. Box, Elder 3+    22. Cedar, red 3+    23. Sweet Gum 3+    24. Pecan Pollen 3+    25. Pine Mix 2+    26. Walnut, Black Pollen 3+    27. Red  Mulberry 3+    28. Ash Mix 3+    29. Birch Mix 3+    30. Beech American 3+    31. Cottonwood, Guinea-Bissau Negative    32. Hickory, White 3+    33. Maple Mix 3+    34. Oak, Guinea-[place] Mix Negative    35. Sycamore Eastern 3+    36. Alternaria Alternata Negative    37. Cladosporium Herbarum Negative    38. Aspergillus Mix Negative    39. Penicillium Mix Negative    40. Bipolaris Sorokiniana (Helminthosporium) Negative    41. Drechslera Spicifera (Curvularia) Negative    42. Mucor Plumbeus Negative    43. Fusarium Moniliforme Negative    44. Aureobasidium Pullulans (pullulara) Negative    45. Rhizopus Oryzae Negative    46. Botrytis Cinera Negative    47. Epicoccum Nigrum Negative    48. Phoma Betae 2+    49. Dust Mite Mix Negative    50. Cat Hair 10,000 BAU/ml Negative    51.  Dog Epithelia Negative    52. Mixed Feathers 3+    53. Horse Epithelia 3+    54. Cockroach, German Negative    55. Tobacco Leaf Negative             13 Food Perc - 12/27/22 1055       Test Information   Time Antigen Placed 1035    Allergen Manufacturer Waynette Buttery    Location Back    Number of allergen test 13    Food Select       Food   1. Peanut Negative    2. Soybean Negative    3. Wheat Negative    4. Sesame Negative    5. Milk, Cow Negative    6. Casein Negative    7. Egg White, Chicken Negative    8. Shellfish Mix Negative    9. Fish Mix Negative    10. Cashew Negative    11. Walnut Food Negative    12. Almond Negative    13. Hazelnut Negative             Intradermal - 12/27/22 1100     Time Antigen Placed 1105    Allergen Manufacturer Greer    Location Arm    Number of Test 10    Control Negative    Bahia 4+    Ragweed Mix 4+    Mold 1 Negative    Mold 2 3+    Mold 3 3+    Mold 4 3+    Mite Mix 4+    Cat 4+    Dog 3+             Food Adult Perc - 12/27/22 1000     Time Antigen Placed 1035    Allergen Manufacturer Waynette Buttery    Location Back    Number of allergen test 4    55. Orange  Negative    56. Lemon Negative    58. Apple Negative    65. Pineapple Negative             Allergy testing results were read and interpreted by myself, documented by clinical staff.  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   -Options include Zyrtec (Cetirizine) 10mg , Claritin (Loratadine) 10mg , Allegra (Fexofenadine) 180mg , or Xyzal (Levocetirinze) 5mg  - Can be purchased over-the-counter if not covered by insurance.  Allergic Conjunctivitis:  - Consider Allergy Eye drops-great options include Pataday (Olopatadine) or Zaditor (ketotifen) for eye symptoms daily as needed-both sold over the counter if not covered by insurance.  -Avoid eye drops that say red eye relief as they may contain medications that dry out your eyes.  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 - If you can eat these things and tolerate the symptoms, it is fine to continue to do so.  If not, you may avoid these fresh fruits and vegetables.   - Heating these foods, buying them canned, and peeling these foods should allow them to be consumed without symptoms or with less symptoms.  Follow up : patch testing at your convenience, 3 months or sooner if needed with provider It was a pleasure meeting you in clinic today! Thank you for allowing me to participate in your care.  This note in its entirety was forwarded to the Provider who requested this consultation.  Thank you for your kind referral. I appreciate the opportunity to take part in Areebah's care. Please do not hesitate to contact me with questions.  Sincerely,  Tonny Bollman, MD [facility]

## 2022-12-27 ENCOUNTER — Other Ambulatory Visit (HOSPITAL_COMMUNITY)
Admission: RE | Admit: 2022-12-27 | Discharge: 2022-12-27 | Disposition: A | Discharge status: Home / Self Care | Payer: Commercial Managed Care - HMO | Source: Ambulatory Visit | Attending: Family Medicine | Admitting: Family Medicine

## 2022-12-27 ENCOUNTER — Other Ambulatory Visit (HOSPITAL_COMMUNITY): Payer: Self-pay | Admitting: Diagnostic Radiology

## 2022-12-27 ENCOUNTER — Encounter: Payer: Self-pay | Admitting: Internal Medicine

## 2022-12-27 ENCOUNTER — Other Ambulatory Visit (HOSPITAL_COMMUNITY): Payer: Self-pay

## 2022-12-27 ENCOUNTER — Ambulatory Visit: Payer: Medicare HMO | Admitting: Internal Medicine

## 2022-12-27 ENCOUNTER — Other Ambulatory Visit: Payer: Self-pay

## 2022-12-27 ENCOUNTER — Ambulatory Visit
Admission: RE | Admit: 2022-12-27 | Discharge: 2022-12-27 | Disposition: A | Discharge status: Home / Self Care | Payer: Commercial Managed Care - HMO | Source: Ambulatory Visit | Attending: Family Medicine | Admitting: Family Medicine

## 2022-12-27 VITALS — BP 160/98 | HR 69 | Temp 97.7°F | Resp 16 | Ht 66.0 in | Wt 244.1 lb

## 2022-12-27 DIAGNOSIS — R21 Rash and other nonspecific skin eruption: Secondary | ICD-10-CM

## 2022-12-27 DIAGNOSIS — T39315A Adverse effect of propionic acid derivatives, initial encounter: Secondary | ICD-10-CM

## 2022-12-27 DIAGNOSIS — Z886 Allergy status to analgesic agent status: Secondary | ICD-10-CM

## 2022-12-27 DIAGNOSIS — H1013 Acute atopic conjunctivitis, bilateral: Secondary | ICD-10-CM | POA: Diagnosis not present

## 2022-12-27 DIAGNOSIS — Z888 Allergy status to other drugs, medicaments and biological substances status: Secondary | ICD-10-CM | POA: Diagnosis not present

## 2022-12-27 DIAGNOSIS — T781XXA Other adverse food reactions, not elsewhere classified, initial encounter: Secondary | ICD-10-CM | POA: Diagnosis not present

## 2022-12-27 DIAGNOSIS — J3089 Other allergic rhinitis: Secondary | ICD-10-CM | POA: Diagnosis not present

## 2022-12-27 DIAGNOSIS — L253 Unspecified contact dermatitis due to other chemical products: Secondary | ICD-10-CM

## 2022-12-27 DIAGNOSIS — R590 Localized enlarged lymph nodes: Secondary | ICD-10-CM | POA: Diagnosis not present

## 2022-12-27 DIAGNOSIS — J302 Other seasonal allergic rhinitis: Secondary | ICD-10-CM | POA: Diagnosis not present

## 2022-12-27 DIAGNOSIS — R599 Enlarged lymph nodes, unspecified: Secondary | ICD-10-CM | POA: Diagnosis not present

## 2022-12-27 MED ORDER — CETIRIZINE HCL 10 MG PO TABS
10.0000 mg | ORAL_TABLET | Freq: Every day | ORAL | 5 refills | Status: AC | PRN
  Filled 2022-12-27: qty 30, 30d supply, fill #0
  Filled 2023-02-07: qty 30, 30d supply, fill #1

## 2022-12-27 MED ORDER — OLOPATADINE HCL 0.2 % OP SOLN
1.0000 [drp] | Freq: Every day | OPHTHALMIC | 5 refills | Status: AC | PRN
  Filled 2022-12-27: qty 2.5, 25d supply, fill #0

## 2022-12-27 NOTE — Patient Instructions (Addendum)
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   -Options include Zyrtec (Cetirizine) 10mg , Claritin (Loratadine) 10mg , Allegra (Fexofenadine) 180mg , or Xyzal (Levocetirinze) 5mg  - Can be purchased over-the-counter if not covered by insurance.  Allergic Conjunctivitis:  - Consider Allergy Eye drops-great options include Pataday (Olopatadine) or Zaditor (ketotifen) for eye symptoms daily as needed-both sold over the counter if not covered by insurance.  -Avoid eye drops that say red eye relief as they may contain medications that dry out your eyes.  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 - If you can eat these things and tolerate the symptoms, it is fine to continue to do so.  If not, you may avoid these fresh fruits and vegetables.   - Heating these foods,  buying them canned, and peeling these foods should allow them to be consumed without symptoms or with less symptoms.  Follow up : patch testing at your convenience, 3 months or sooner if needed with provider It was a pleasure meeting you in clinic today! Thank you for allowing me to participate in your care.  Tonny Bollman, MD [facility] Reducing Pollen Exposure  The American Academy of Allergy, Asthma and Immunology suggests the following steps to reduce your exposure to pollen during allergy seasons.    Do not hang sheets or clothing out to dry; pollen may collect on these items. Do not mow lawns or spend time around freshly cut grass; mowing stirs up pollen. Keep windows closed at night.  Keep car windows closed while driving. Minimize morning activities outdoors, a time when pollen counts are usually at their highest. Stay indoors as much as possible when pollen counts or humidity is high and on windy days when pollen tends to remain in the air longer. Use air conditioning when possible.  Many air conditioners have filters that trap the pollen spores. Use a HEPA room air filter to remove pollen form the indoor air you breathe. Control of Mold Allergen   Mold and fungi can grow on a variety of surfaces provided certain temperature and moisture conditions exist.  Outdoor molds grow on plants, decaying vegetation and soil.  The major outdoor mold, Alternaria and Cladosporium, are found in very high numbers during hot and dry conditions.  Generally, a late Summer - Fall peak is seen for common outdoor fungal spores.  Rain will temporarily  lower outdoor mold spore count, but counts rise rapidly when the rainy period ends.  The most important indoor molds are Aspergillus and Penicillium.  Dark, humid and poorly ventilated basements are ideal sites for mold growth.  The next most common sites of mold growth are the bathroom and the kitchen.  Outdoor (Seasonal) Mold  Control  Use air conditioning and keep windows closed Avoid exposure to decaying vegetation. Avoid leaf raking. Avoid grain handling. Consider wearing a face mask if working in moldy areas.    Indoor (Perennial) Mold Control   Maintain humidity below 50%. Clean washable surfaces with 5% bleach solution. Remove sources e.g. contaminated carpets.   DUST MITE AVOIDANCE MEASURES:  There are three main measures that need and can be taken to avoid house dust mites:  Reduce accumulation of dust in general -reduce furniture, clothing, carpeting, books, stuffed animals, especially in bedroom  Separate yourself from the dust -use pillow and mattress encasements (can be found at stores such as [employer] or online) -avoid direct exposure to air condition flow -use a HEPA filter device, especially in the bedroom; you can also use a HEPA filter vacuum cleaner -wipe dust with a moist towel instead of a dry towel or broom when cleaning  Decrease mites and/or their secretions -wash clothing and linen and stuffed animals at highest temperature possible, at least every 2 weeks -stuffed animals can also be placed in a bag and put in a freezer overnight  Despite the above measures, it is impossible to eliminate dust mites or their allergen completely from your home.  With the above measures the burden of mites in your home can be diminished, with the goal of minimizing your allergic symptoms.  Success will be reached only when implementing and using all means together. Control of Dog or Cat Allergen  Avoidance is the best way to manage a dog or cat allergy. If you have a dog or cat and are allergic to dog or cats, consider removing the dog or cat from the home. If you have a dog or cat but don't want to find it a new home, or if your family wants a pet even though someone in the household is allergic, here are some strategies that may help keep symptoms at bay:  Keep the pet out of your  bedroom and restrict it to only a few rooms. Be advised that keeping the dog or cat in only one room will not limit the allergens to that room. Don't pet, hug or kiss the dog or cat; if you do, wash your hands with soap and water. High-efficiency particulate air (HEPA) cleaners run continuously in a bedroom or living room can reduce allergen levels over time. Regular use of a high-efficiency vacuum cleaner or a central vacuum can reduce allergen levels. Giving your dog or cat a bath at least once a week can reduce airborne allergen.

## 2022-12-29 ENCOUNTER — Ambulatory Visit (INDEPENDENT_AMBULATORY_CARE_PROVIDER_SITE_OTHER): Payer: Commercial Managed Care - HMO | Admitting: Family Medicine

## 2022-12-29 ENCOUNTER — Other Ambulatory Visit (HOSPITAL_COMMUNITY): Payer: Self-pay

## 2022-12-29 ENCOUNTER — Encounter: Payer: Self-pay | Admitting: Family Medicine

## 2022-12-29 ENCOUNTER — Other Ambulatory Visit: Payer: Self-pay

## 2022-12-29 VITALS — BP 142/86 | HR 83 | Ht 65.0 in | Wt 247.6 lb

## 2022-12-29 DIAGNOSIS — M549 Dorsalgia, unspecified: Secondary | ICD-10-CM | POA: Diagnosis not present

## 2022-12-29 DIAGNOSIS — Z1211 Encounter for screening for malignant neoplasm of colon: Secondary | ICD-10-CM | POA: Diagnosis not present

## 2022-12-29 DIAGNOSIS — R030 Elevated blood-pressure reading, without diagnosis of hypertension: Secondary | ICD-10-CM

## 2022-12-29 LAB — SURGICAL PATHOLOGY

## 2022-12-29 MED ORDER — BLOOD PRESSURE CUFF MISC: 0 refills | Status: AC

## 2022-12-29 MED ORDER — ACETAMINOPHEN 500 MG PO TABS
500.0000 mg | ORAL_TABLET | Freq: Four times a day (QID) | ORAL | 0 refills | Status: DC | PRN
  Filled 2022-12-29: qty 30, 8d supply, fill #0

## 2022-12-29 MED ORDER — DICLOFENAC SODIUM 1 % EX GEL
4.0000 g | Freq: Four times a day (QID) | CUTANEOUS | 2 refills | Status: AC
  Filled 2022-12-29: qty 100, 7d supply, fill #0

## 2022-12-29 NOTE — Patient Instructions (Addendum)
It was great seeing you today!  For your back pain I recommend continuing stretches, exercising and weight loss.   Today we are doing your colon cancer screen as well.   I have included information on the pneumonia vaccine and lung cancer screen  Your blood pressure is elevated I would like for you to check it at home for a few days and bring those in at your next follow up. It will be likely that you would need to start blood pressure mediation to help bring it down.   Please check-out at the front desk before leaving the clinic. Return to see your new PCP in 2 weeks for blood pressure but if you need to be seen earlier than that for any new issues we're happy to fit you in, just give [place] a call!  Visit Reminders: - Stop by the pharmacy to pick up your prescriptions  - Continue to work on your healthy eating habits and incorporating exercise into your daily life.   Feel free to call with any questions or concerns at any time, at [phone].   Take care,  Dr. Cora Collum [facility]  Pneumococcal Conjugate Vaccine (PCV13) Injection What is this medication? PNEUMOCOCCAL CONJUGATE VACCINE (NEU mo KOK al kon ju gate vak SEEN) reduces the risk of pneumococcal disease, such as pneumonia. It does not treat pneumococcal disease. It is still possible to get pneumococcal disease after receiving this vaccine, but the symptoms may be less severe or not last as long. It works by helping your immune system learn how to fight off a future infection. This medicine may be used for other purposes; ask your health care provider or pharmacist if you have questions. COMMON BRAND NAME(S): Prevnar, Prevnar 13 What should I tell my care team before I take this medication? They need to know if you have any of these conditions: Bleeding problems Fever Immune system problems An unusual or allergic reaction to pneumococcal vaccine, diphtheria toxoid, other vaccines, latex, other  medications, foods, dyes, or preservatives Pregnant or trying to get pregnant Breastfeeding How should I use this medication? This vaccine is injected into a muscle. It is given by your care team. A copy of Vaccine Information Statements will be given before each vaccination. Be sure to read this information carefully each time. This sheet may change frequently. Talk to your care team about the use of this medication in children. While it may be prescribed for children as young as 54 weeks old for selected conditions, precautions do apply. Overdosage: If you think you have taken too much of this medicine contact a poison control center or emergency room at once. NOTE: This medicine is only for you. Do not share this medicine with others. What if I miss a dose? Keep appointments for follow-up doses as directed. It is important not to miss your dose. Call your care team if you are unable to keep an appointment. What may interact with this medication? Medications for cancer chemotherapy Medications that suppress your immune function Steroid medications, such as prednisone or cortisone This list may not describe all possible interactions. Give your health care provider a list of all the medicines, herbs, non-prescription drugs, or dietary supplements you use. Also tell them if you smoke, drink alcohol, or use illegal drugs. Some items may interact with your medicine. What should I watch for while using this medication? Visit your care team regularly. Report any side effects to your care team right away. This vaccine,  like all vaccines, may not fully protect everyone. What side effects may I notice from receiving this medication? Side effects that you should report to your care team as soon as possible: Allergic reactions--skin rash, itching, hives, swelling of the face, lips, tongue, or throat Side effects that usually do not require medical attention (report these to your care team if they  continue or are bothersome): Chills Fatigue Fever Headache Irritability Loss of appetite Pain, redness, or irritation at injection site This list may not describe all possible side effects. Call your doctor for medical advice about side effects. You may report side effects to FDA at 1-800-FDA-1088. Where should I keep my medication? This vaccine is only given by your care team. It will not be stored at home. NOTE: This sheet is a summary. It may not cover all possible information. If you have questions about this medicine, talk to your doctor, pharmacist, or health care provider.  2024 Elsevier/Gold Standard (2021-12-07 00:00:00)    Lung Cancer Screening A lung cancer screening is a test that checks for lung cancer when there are no symptoms or history of that disease. The screening is done to look for lung cancer in its very early stages. Finding cancer early improves the chances of successful treatment. It may save your life. Who should have a screening? You should be screened for lung cancer if all of these apply: You currently smoke or you used to smoke. You are between the ages of 60 and 35 years old. Screening may be recommended up to age 28 depending on your overall health and other factors. You have a smoking history of 1 pack of cigarettes a day for 20 years or 2 packs a day for 10 years. How is screening done?  The recommended screening test is a low-dose computed tomography (LDCT) scan. This scan takes detailed images of the lungs. This allows a health care provider to look for abnormal cells. If you are at risk for lung cancer, it is recommended that you get screened once a year. Talk to your health care provider about the risks, benefits, and limitations of screening. What are the benefits of screening? Screening can find lung cancer early, before symptoms start and before it has spread outside of the lungs. The chances of curing lung cancer are greater if the cancer is  diagnosed early. What are the risks of screening? The screening may show lung cancer when no cancer is present. Talk with your health care provider about what your results mean. In some cases, your health care provider may do more testing to confirm the results. The screening may not find lung cancer when it is present. You will be exposed to radiation from repeated LDCT tests, which can cause cancer in otherwise healthy people. How can I lower my risk of lung cancer? Make these lifestyle changes to lower your risk of developing lung cancer: Do not use any products that contain nicotine or tobacco. These products include cigarettes, chewing tobacco, and vaping devices, such as e-cigarettes. If you need help quitting, ask your health care provider. Avoid secondhand smoke. Avoid exposure to radiation. Avoid exposure to radon gas. Have your home checked for radon regularly. Avoid things that cause cancer (carcinogens). Avoid living or working in places with high air pollution or diesel exhaust. Questions to ask your health care provider Am I eligible for lung cancer screening? Does my health insurance cover the cost of lung cancer screening? What happens if the lung cancer screening shows something  of concern? How soon will I have results from my lung cancer screening? Is there anything that I need to do to prepare for my lung cancer screening? What happens if I decide not to have lung cancer screening? Where to find more information Ask your health care provider about the risks and benefits of screening. More information and resources are available from these organizations: American Cancer Society (ACS): cancer.org American Lung Association: lung.org National Cancer Institute: cancer.gov Contact a health care provider if: You start to show symptoms of lung cancer, including: A cough that will not go away. High-pitched whistling sounds when you breathe, most often when you breathe out  (wheezing). Chest pain. Coughing up blood. Shortness of breath. Weight loss that cannot be explained. Constant tiredness (fatigue). Hoarse voice. Summary Lung cancer screening may find lung cancer before symptoms appear. Finding cancer early improves the chances of successful treatment. It may save your life. The recommended screening test is a low-dose computed tomography (LDCT) scan that looks for abnormal cells in the lungs. If you are at risk for lung cancer, it is recommended that you get screened once a year. You can make lifestyle changes to lower your risk of lung cancer. Ask your health care provider about the risks and benefits of screening. This information is not intended to replace advice given to you by your health care provider. Make sure you discuss any questions you have with your health care provider. Document Revised: 07/04/2022 Document Reviewed: 12/15/2020 Elsevier Patient Education  2024 [employer].

## 2022-12-29 NOTE — Progress Notes (Unsigned)
    SUBJECTIVE:   CHIEF COMPLAINT / HPI:   Patient presents for follow up  Endorses heaviness in back and hips gradually worsening over the past several months without inciting injury. States she works in [employer] keeping so doing a lot of bending and moving.   Was seen by allergist for testing and has follow up scheduled for patch testing   Had mammogram done which showed abnormal lymph nodes and US guided biopsy of R axillary lymph node which she states she was just called about results and it did not show cancer   Tobacco  use: 1/2- 1 pack a day for about 40 years.  Not ready to quit smoking. Not desiring medication to help quit   Requests cologuard testing   Discussed CT scan and pneumonia vaccine, declines for now  PERTINENT  PMH / PSH: Reviewed   OBJECTIVE:   BP (!) 142/86   Pulse 83   Ht 5\' 5"  (1.651 m)   Wt 247 lb 9.6 oz (112.3 kg)   SpO2 100%   BMI 41.20 kg/m    Physical exam General: well appearing, NAD Cardiovascular: RRR, no murmurs Lungs: CTAB. Normal WOB Abdomen: soft, non-distended Skin: warm, dry. No edema MSK Lumbar spine: - Inspection: no gross deformity or asymmetry, swelling or ecchymosis - Palpation: No TTP over the spinous processes, paraspinal muscles, or SI joints b/l - ROM: full active ROM of the lumbar spine in flexion and extension without pain - Strength: 5/5 strength of lower extremity  - Neuro: sensation intact - Special testing: Negative straight leg raise, Negative FABER  ASSESSMENT/PLAN:   Elevated blood pressure reading BP 157/80 initially an 142/86 on repeat. She does not take medication for her blood pressure but she has had multiple elevated blood pressures in the clinic. Sent a BP cuff to pharmacy and recommended checking BP at home and returning for follow up appointment in which she will likely need to start medication. She expressed understanding .  Discomfort of back Patient presents with lower back and hip discomfort that is  progressive, likely due to increased activity with work as well as potentially arthritis and weight contributing which I discussed with patient. No red flag symptoms and physical exam unremarkable. Recommended exercising/stretching, weight loss, Tylenol prn, and Voltaren gel ordered. Return precautions discussed.   Colon cancer screen - Fecal occult immunochemical ordered   Health maintenance - Declines pneumonia vaccine and lung cancer screen. Provided information about both - Declines tobacco cessation, will continue to discuss   Cora Collum, DO [facility]

## 2022-12-31 DIAGNOSIS — R030 Elevated blood-pressure reading, without diagnosis of hypertension: Secondary | ICD-10-CM | POA: Insufficient documentation

## 2022-12-31 DIAGNOSIS — M549 Dorsalgia, unspecified: Secondary | ICD-10-CM | POA: Insufficient documentation

## 2022-12-31 NOTE — Assessment & Plan Note (Signed)
BP 157/80 initially an 142/86 on repeat. She does not take medication for her blood pressure but she has had multiple elevated blood pressures in the clinic. Sent a BP cuff to pharmacy and recommended checking BP at home and returning for follow up appointment in which she will likely need to start medication. She expressed understanding .

## 2022-12-31 NOTE — Assessment & Plan Note (Signed)
Patient presents with lower back and hip discomfort that is progressive, likely due to increased activity with work as well as potentially arthritis and weight contributing which I discussed with patient. No red flag symptoms and physical exam unremarkable. Recommended exercising/stretching, weight loss, Tylenol prn, and Voltaren gel ordered. Return precautions discussed.

## 2023-01-01 ENCOUNTER — Encounter: Payer: Self-pay | Admitting: Family

## 2023-01-01 ENCOUNTER — Ambulatory Visit (INDEPENDENT_AMBULATORY_CARE_PROVIDER_SITE_OTHER): Payer: Medicare HMO | Admitting: Family

## 2023-01-01 VITALS — BP 160/80 | HR 86 | Temp 98.2°F | Resp 12 | Wt 245.5 lb

## 2023-01-01 DIAGNOSIS — R21 Rash and other nonspecific skin eruption: Secondary | ICD-10-CM | POA: Diagnosis not present

## 2023-01-01 DIAGNOSIS — L253 Unspecified contact dermatitis due to other chemical products: Secondary | ICD-10-CM

## 2023-01-01 NOTE — Progress Notes (Signed)
Follow-up Note  RE: Kayla Blackburn MRN: [id number] DOB: Dec 25, 1955 Date of Office Visit: 01/01/2023  Primary care provider: Cora Collum, DO Referring provider: Cora Collum, DO   Kayla Blackburn returns to the office today for the patch test placement, given suspected history of contact dermatitis.    Diagnostics: True Test patches placed.    Plan:   Allergic contact dermatitis - Instructions provided on care of the patches for the next 48 hours. Kayla Blackburn was instructed to avoid showering for the next 48 hours. Kayla Blackburn will follow up in 48 hours and 96 hours for patch readings.  Recommend scheduling an appointment with your primary care physician to discuss your elevated blood pressure.  Nehemiah Settle, FNP [facility]

## 2023-01-03 ENCOUNTER — Ambulatory Visit: Payer: Medicare HMO | Admitting: Allergy

## 2023-01-03 ENCOUNTER — Encounter: Payer: Self-pay | Admitting: Allergy

## 2023-01-03 DIAGNOSIS — L2389 Allergic contact dermatitis due to other agents: Secondary | ICD-10-CM | POA: Insufficient documentation

## 2023-01-03 NOTE — Progress Notes (Signed)
Follow Up Note  RE: FAIRY ASHLOCK MRN: [id number] DOB: 1956-03-20 Date of Office Visit: 01/03/2023  Referring provider: Cora Collum, DO Primary care provider: Cora Collum, DO  History of Present Illness: I had the pleasure of seeing Kayla Blackburn for a follow up visit at the [facility] on 01/03/2023. She is a 67 y.o. female, who is being followed for contact dermatitis. Today she is here for initial patch test interpretation, given suspected history of contact dermatitis.   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 Discussed with patient that patch 1 and 3 on the bottom curled over and most likely did not make good contact with the skin.     Diagnostics:  TRUE TEST 48 hour reading:   T.R.U.E. Test - 01/03/23 1600       Test Information   Time Antigen Placed 1629    Manufacturer Other    Lot # [id number]    Location Back    Number of Test 36    Reading Interval Day 1;Day 3;Day 5    Panel Panel 1;Panel 2;Panel 3      Panel 1   1. Nickel Sulfate 0    2. Wool Alcohols 0    3. Neomycin Sulfate 0    4. Potassium Dichromate 0    5. Caine Mix 0    6. Fragrance Mix 2    7. Colophony 0    8. Paraben Mix 0    9. Negative Control 0    10. Balsam of [place] Omitted   no contact with skin   11. Ethylenediamine Dihydrochloride 0    12. Cobalt Dichloride 0      Panel 2   13. p-tert Butylphenol Formaldehyde Resin 0    14. Epoxy Resin 0    15. Carba Mix 0    16.  Black Rubber Mix 0    17. Cl+ Me-Isothiazolinone 0    18. Quaternium-15 0    19. Methyldibromo Glutaronitrile 0    20. p-Phenylenediamine 3    21. Formaldehyde 0    22. Mercapto Mix 0    23. Thimerosal 0    24. Thiuram Mix 0      Panel 3   25. Diazolidinyl Urea 0    26. Quinoline Mix 0    27. Tixocortol-21-Pivalate 0    28. Gold Sodium Thiosulfate 0    29. Imidazolidinyl Urea Omitted   folded over   30. Budesonide Omitted   folded over   31.  Hydrocortisone-17-Butyrate 0    32. Mercaptobenzothiazole 0    33. Bacitracin 0    34. Parthenolide 0    35. Disperse Blue 106 Omitted   folded over   36. 2-Bromo-2-Nitropropane-1,3-diol Omitted   folded over             Assessment and Plan: Ma is a 67 y.o. female with: Allergic contact dermatitis due to other agents TRUE patches removed. 48 hour reading positive to:  Fragrance mix and P-Phenylenediamine.  Patch 1 and patch 3 folder over the bottom - unreliable reads as most likely did not have proper contact to the skin. Discussed with patient that if results do not explain her issues then may need to redo patches when it is less warm outdoors and patient is able to move and sweat less.   The patient has been provided detailed information regarding the substances she is sensitive to, as well as  products containing the substances.  Meticulous avoidance of these substances is recommended. If avoidance is not possible, the use of barrier creams or lotions is recommended. If symptoms persist or progress despite meticulous avoidance of chemicals/substances above, dermatology evaluation may be warranted.  Return in about 2 days (around 01/05/2023) for Patch reading.  It was my pleasure to see Keona today and participate in her care. Please feel free to contact me with any questions or concerns.  Sincerely,  Wyline Mood, DO Allergy & Immunology  [facility] [place] office: [phone] [facility] office: [phone] [place] office: [phone]

## 2023-01-03 NOTE — Assessment & Plan Note (Addendum)
TRUE patches removed. 48 hour reading positive to:  Fragrance mix and P-Phenylenediamine.  Patch 1 and patch 3 folder over the bottom - unreliable reads as most likely did not have proper contact to the skin. Discussed with patient that if results do not explain her issues then may need to redo patches when it is less warm outdoors and patient is able to move and sweat less.

## 2023-01-05 ENCOUNTER — Ambulatory Visit (INDEPENDENT_AMBULATORY_CARE_PROVIDER_SITE_OTHER): Payer: Medicare HMO | Admitting: Internal Medicine

## 2023-01-05 DIAGNOSIS — L2389 Allergic contact dermatitis due to other agents: Secondary | ICD-10-CM

## 2023-01-05 NOTE — Progress Notes (Signed)
   Follow Up Note  RE: Kayla Blackburn MRN: [id number] DOB: 05-09-1956 Date of Office Visit: 01/05/2023  Referring provider: Cora Collum, DO Primary care provider: Lincoln Brigham, MD  History of Present Illness: I had the pleasure of seeing Kayla Blackburn for a follow up visit at the [facility] on 01/09/2023. She is a 67 y.o. female, who is being followed for contact dermatitis. Today she is here for final patch test interpretation, given suspected history of contact dermatitis.   Diagnostics:  TRUE TEST 96 hour reading: positive to fragrance mix and PPD     Assessment and Plan: Kayla Blackburn is a 67 y.o. female with: Concern for Contact Dermatitis:  The patient has been provided detailed information regarding the substances she is sensitive to, as well as products containing the substances.  Meticulous avoidance of these substances is recommended. If avoidance is not possible, the use of barrier creams or lotions is recommended. If symptoms persist or progress despite meticulous avoidance of chemicals/substances above, dermatology evaluation may be warranted. No follow-ups on file.  It was my pleasure to see Kayla Blackburn today and participate in her care. Please feel free to contact me with any questions or concerns.  Sincerely,   Ferol Luz, MD [facility]

## 2023-02-07 ENCOUNTER — Other Ambulatory Visit: Payer: Self-pay | Admitting: Family Medicine

## 2023-02-08 ENCOUNTER — Other Ambulatory Visit (HOSPITAL_COMMUNITY): Payer: Self-pay

## 2023-02-08 ENCOUNTER — Other Ambulatory Visit: Payer: Self-pay

## 2023-02-08 MED ORDER — ACETAMINOPHEN 500 MG PO TABS
500.0000 mg | ORAL_TABLET | Freq: Four times a day (QID) | ORAL | 0 refills | Status: AC | PRN
  Filled 2023-02-08: qty 30, 8d supply, fill #0

## 2023-02-09 ENCOUNTER — Other Ambulatory Visit (HOSPITAL_COMMUNITY): Payer: Self-pay

## 2023-02-13 ENCOUNTER — Other Ambulatory Visit: Payer: Self-pay

## 2023-06-08 ENCOUNTER — Other Ambulatory Visit (HOSPITAL_COMMUNITY): Payer: Self-pay

## 2023-06-08 DIAGNOSIS — Z1330 Encounter for screening examination for mental health and behavioral disorders, unspecified: Secondary | ICD-10-CM | POA: Diagnosis not present

## 2023-06-08 DIAGNOSIS — R9431 Abnormal electrocardiogram [ECG] [EKG]: Secondary | ICD-10-CM | POA: Diagnosis not present

## 2023-06-08 DIAGNOSIS — F4321 Adjustment disorder with depressed mood: Secondary | ICD-10-CM | POA: Diagnosis not present

## 2023-06-08 DIAGNOSIS — R7303 Prediabetes: Secondary | ICD-10-CM | POA: Diagnosis not present

## 2023-06-08 DIAGNOSIS — Z0000 Encounter for general adult medical examination without abnormal findings: Secondary | ICD-10-CM | POA: Diagnosis not present

## 2023-06-08 DIAGNOSIS — M545 Low back pain, unspecified: Secondary | ICD-10-CM | POA: Diagnosis not present

## 2023-06-08 DIAGNOSIS — I10 Essential (primary) hypertension: Secondary | ICD-10-CM | POA: Diagnosis not present

## 2023-06-08 DIAGNOSIS — R5383 Other fatigue: Secondary | ICD-10-CM | POA: Diagnosis not present

## 2023-06-08 DIAGNOSIS — G8929 Other chronic pain: Secondary | ICD-10-CM | POA: Diagnosis not present

## 2023-06-08 MED ORDER — AMLODIPINE BESYLATE 5 MG PO TABS
ORAL_TABLET | ORAL | 5 refills | Status: AC
  Filled 2023-06-08: qty 30, 30d supply, fill #0

## 2023-06-08 MED ORDER — BLOOD PRESSURE MONITOR MISC: 0 refills | Status: AC

## 2023-06-13 ENCOUNTER — Other Ambulatory Visit (HOSPITAL_COMMUNITY): Payer: Self-pay

## 2023-06-13 MED ORDER — VITAMIN D3 1.25 MG (50000 UT) PO CAPS
50000.0000 [IU] | ORAL_CAPSULE | ORAL | 0 refills | Status: AC
  Filled 2023-06-13: qty 24, 84d supply, fill #0

## 2023-06-13 MED ORDER — VITAMIN B-12 1000 MCG PO TABS
1000.0000 ug | ORAL_TABLET | Freq: Every day | ORAL | 1 refills | Status: AC
  Filled 2023-06-13: qty 90, 90d supply, fill #0

## 2023-06-23 ENCOUNTER — Other Ambulatory Visit (HOSPITAL_COMMUNITY): Payer: Self-pay

## 2023-07-06 DIAGNOSIS — H2513 Age-related nuclear cataract, bilateral: Secondary | ICD-10-CM | POA: Diagnosis not present

## 2023-07-06 DIAGNOSIS — Z0100 Encounter for examination of eyes and vision without abnormal findings: Secondary | ICD-10-CM | POA: Diagnosis not present

## 2023-07-26 DIAGNOSIS — R9431 Abnormal electrocardiogram [ECG] [EKG]: Secondary | ICD-10-CM | POA: Diagnosis not present

## 2023-07-26 DIAGNOSIS — I10 Essential (primary) hypertension: Secondary | ICD-10-CM | POA: Diagnosis not present

## 2023-07-26 DIAGNOSIS — R7303 Prediabetes: Secondary | ICD-10-CM | POA: Diagnosis not present

## 2023-07-26 DIAGNOSIS — E66813 Obesity, class 3: Secondary | ICD-10-CM | POA: Diagnosis not present

## 2023-07-26 DIAGNOSIS — Z6841 Body Mass Index (BMI) 40.0 and over, adult: Secondary | ICD-10-CM | POA: Diagnosis not present

## 2023-07-26 DIAGNOSIS — I119 Hypertensive heart disease without heart failure: Secondary | ICD-10-CM | POA: Diagnosis not present

## 2023-07-26 DIAGNOSIS — Z720 Tobacco use: Secondary | ICD-10-CM | POA: Diagnosis not present

## 2023-08-06 ENCOUNTER — Other Ambulatory Visit (HOSPITAL_COMMUNITY): Payer: Self-pay

## 2023-08-06 MED ORDER — AMLODIPINE BESYLATE 5 MG PO TABS
5.0000 mg | ORAL_TABLET | Freq: Every day | ORAL | 5 refills | Status: AC
  Filled 2023-08-06: qty 30, 30d supply, fill #0

## 2023-08-09 DIAGNOSIS — E559 Vitamin D deficiency, unspecified: Secondary | ICD-10-CM | POA: Diagnosis not present

## 2023-08-09 DIAGNOSIS — Z720 Tobacco use: Secondary | ICD-10-CM | POA: Diagnosis not present

## 2023-08-09 DIAGNOSIS — E7841 Elevated Lipoprotein(a): Secondary | ICD-10-CM | POA: Diagnosis not present

## 2023-08-09 DIAGNOSIS — R7303 Prediabetes: Secondary | ICD-10-CM | POA: Diagnosis not present

## 2023-08-09 DIAGNOSIS — I10 Essential (primary) hypertension: Secondary | ICD-10-CM | POA: Diagnosis not present

## 2023-08-09 DIAGNOSIS — F411 Generalized anxiety disorder: Secondary | ICD-10-CM | POA: Diagnosis not present

## 2023-08-09 DIAGNOSIS — Z1330 Encounter for screening examination for mental health and behavioral disorders, unspecified: Secondary | ICD-10-CM | POA: Diagnosis not present

## 2023-08-09 DIAGNOSIS — E538 Deficiency of other specified B group vitamins: Secondary | ICD-10-CM | POA: Diagnosis not present

## 2023-08-09 DIAGNOSIS — E66813 Obesity, class 3: Secondary | ICD-10-CM | POA: Diagnosis not present

## 2023-08-13 DIAGNOSIS — I119 Hypertensive heart disease without heart failure: Secondary | ICD-10-CM | POA: Diagnosis not present

## 2023-10-04 DIAGNOSIS — E7841 Elevated Lipoprotein(a): Secondary | ICD-10-CM | POA: Diagnosis not present

## 2023-10-04 DIAGNOSIS — M79641 Pain in right hand: Secondary | ICD-10-CM | POA: Diagnosis not present

## 2023-10-04 DIAGNOSIS — E538 Deficiency of other specified B group vitamins: Secondary | ICD-10-CM | POA: Diagnosis not present

## 2023-10-04 DIAGNOSIS — E559 Vitamin D deficiency, unspecified: Secondary | ICD-10-CM | POA: Diagnosis not present

## 2023-10-04 DIAGNOSIS — E66813 Obesity, class 3: Secondary | ICD-10-CM | POA: Diagnosis not present

## 2023-10-04 DIAGNOSIS — I10 Essential (primary) hypertension: Secondary | ICD-10-CM | POA: Diagnosis not present

## 2023-10-04 DIAGNOSIS — R7303 Prediabetes: Secondary | ICD-10-CM | POA: Diagnosis not present

## 2023-10-04 DIAGNOSIS — F411 Generalized anxiety disorder: Secondary | ICD-10-CM | POA: Diagnosis not present

## 2023-11-29 DIAGNOSIS — G5603 Carpal tunnel syndrome, bilateral upper limbs: Secondary | ICD-10-CM | POA: Diagnosis not present

## 2023-11-29 DIAGNOSIS — I10 Essential (primary) hypertension: Secondary | ICD-10-CM | POA: Diagnosis not present

## 2023-11-29 DIAGNOSIS — G8929 Other chronic pain: Secondary | ICD-10-CM | POA: Diagnosis not present

## 2023-11-29 DIAGNOSIS — R7303 Prediabetes: Secondary | ICD-10-CM | POA: Diagnosis not present

## 2023-11-29 DIAGNOSIS — E66813 Obesity, class 3: Secondary | ICD-10-CM | POA: Diagnosis not present

## 2023-11-29 DIAGNOSIS — E7841 Elevated Lipoprotein(a): Secondary | ICD-10-CM | POA: Diagnosis not present

## 2023-11-29 DIAGNOSIS — F4323 Adjustment disorder with mixed anxiety and depressed mood: Secondary | ICD-10-CM | POA: Diagnosis not present

## 2023-11-29 DIAGNOSIS — Z6841 Body Mass Index (BMI) 40.0 and over, adult: Secondary | ICD-10-CM | POA: Diagnosis not present

## 2023-11-29 DIAGNOSIS — M545 Low back pain, unspecified: Secondary | ICD-10-CM | POA: Diagnosis not present

## 2023-12-27 DIAGNOSIS — M180 Bilateral primary osteoarthritis of first carpometacarpal joints: Secondary | ICD-10-CM | POA: Diagnosis not present

## 2023-12-27 DIAGNOSIS — G8929 Other chronic pain: Secondary | ICD-10-CM | POA: Diagnosis not present

## 2023-12-27 DIAGNOSIS — M545 Low back pain, unspecified: Secondary | ICD-10-CM | POA: Diagnosis not present

## 2023-12-27 DIAGNOSIS — M47816 Spondylosis without myelopathy or radiculopathy, lumbar region: Secondary | ICD-10-CM | POA: Diagnosis not present

## 2023-12-27 DIAGNOSIS — M160 Bilateral primary osteoarthritis of hip: Secondary | ICD-10-CM | POA: Diagnosis not present

## 2023-12-27 DIAGNOSIS — M47817 Spondylosis without myelopathy or radiculopathy, lumbosacral region: Secondary | ICD-10-CM | POA: Diagnosis not present

## 2023-12-27 DIAGNOSIS — I10 Essential (primary) hypertension: Secondary | ICD-10-CM | POA: Diagnosis not present

## 2024-02-08 DIAGNOSIS — I119 Hypertensive heart disease without heart failure: Secondary | ICD-10-CM | POA: Diagnosis not present

## 2024-02-08 DIAGNOSIS — Z720 Tobacco use: Secondary | ICD-10-CM | POA: Diagnosis not present

## 2024-02-08 DIAGNOSIS — I10 Essential (primary) hypertension: Secondary | ICD-10-CM | POA: Diagnosis not present

## 2024-02-08 DIAGNOSIS — E785 Hyperlipidemia, unspecified: Secondary | ICD-10-CM | POA: Diagnosis not present

## 2024-02-08 DIAGNOSIS — E669 Obesity, unspecified: Secondary | ICD-10-CM | POA: Diagnosis not present

## 2024-02-12 DIAGNOSIS — M791 Myalgia, unspecified site: Secondary | ICD-10-CM | POA: Diagnosis not present

## 2024-02-12 DIAGNOSIS — M25512 Pain in left shoulder: Secondary | ICD-10-CM | POA: Diagnosis not present

## 2024-02-12 DIAGNOSIS — I10 Essential (primary) hypertension: Secondary | ICD-10-CM | POA: Diagnosis not present

## 2024-02-12 DIAGNOSIS — S161XXA Strain of muscle, fascia and tendon at neck level, initial encounter: Secondary | ICD-10-CM | POA: Diagnosis not present

## 2024-02-12 DIAGNOSIS — W19XXXA Unspecified fall, initial encounter: Secondary | ICD-10-CM | POA: Diagnosis not present

## 2024-03-28 DIAGNOSIS — E7841 Elevated Lipoprotein(a): Secondary | ICD-10-CM | POA: Diagnosis not present

## 2024-03-28 DIAGNOSIS — E559 Vitamin D deficiency, unspecified: Secondary | ICD-10-CM | POA: Diagnosis not present

## 2024-03-28 DIAGNOSIS — F17210 Nicotine dependence, cigarettes, uncomplicated: Secondary | ICD-10-CM | POA: Diagnosis not present

## 2024-03-28 DIAGNOSIS — Z130 Encounter for screening for diseases of the blood and blood-forming organs and certain disorders involving the immune mechanism: Secondary | ICD-10-CM | POA: Diagnosis not present

## 2024-03-28 DIAGNOSIS — R7989 Other specified abnormal findings of blood chemistry: Secondary | ICD-10-CM | POA: Diagnosis not present

## 2024-03-28 DIAGNOSIS — Z780 Asymptomatic menopausal state: Secondary | ICD-10-CM | POA: Diagnosis not present

## 2024-03-28 DIAGNOSIS — Z0000 Encounter for general adult medical examination without abnormal findings: Secondary | ICD-10-CM | POA: Diagnosis not present

## 2024-03-28 DIAGNOSIS — I10 Essential (primary) hypertension: Secondary | ICD-10-CM | POA: Diagnosis not present

## 2024-03-28 DIAGNOSIS — R7303 Prediabetes: Secondary | ICD-10-CM | POA: Diagnosis not present

## 2024-03-28 DIAGNOSIS — Z1329 Encounter for screening for other suspected endocrine disorder: Secondary | ICD-10-CM | POA: Diagnosis not present

## 2024-04-17 DIAGNOSIS — Z780 Asymptomatic menopausal state: Secondary | ICD-10-CM | POA: Diagnosis not present
